# Patient Record
Sex: Female | Born: 1980 | Race: White | Hispanic: No | Smoking: Never smoker
Health system: Southern US, Community
[De-identification: ages and names within clinical notes are randomized; demographics above are authoritative.]

## PROBLEM LIST (undated history)

## (undated) DIAGNOSIS — B009 Herpesviral infection, unspecified: Secondary | ICD-10-CM

## (undated) DIAGNOSIS — J342 Deviated nasal septum: Secondary | ICD-10-CM

## (undated) DIAGNOSIS — J302 Other seasonal allergic rhinitis: Secondary | ICD-10-CM

## (undated) HISTORY — PX: LAPAROSCOPY: SHX197

## (undated) HISTORY — DX: Herpesviral infection, unspecified: B00.9

## (undated) HISTORY — PX: LIPOSUCTION: SHX10

## (undated) HISTORY — DX: Deviated nasal septum: J34.2

---

## 2002-03-21 DIAGNOSIS — Z9151 Personal history of suicidal behavior: Secondary | ICD-10-CM | POA: Insufficient documentation

## 2003-11-17 ENCOUNTER — Encounter: Admission: RE | Admit: 2003-11-17 | Discharge: 2003-11-17 | Payer: Self-pay | Admitting: Family Medicine

## 2004-05-28 ENCOUNTER — Other Ambulatory Visit: Admission: RE | Admit: 2004-05-28 | Discharge: 2004-05-28 | Payer: Self-pay | Admitting: Obstetrics and Gynecology

## 2004-06-11 ENCOUNTER — Other Ambulatory Visit: Admission: RE | Admit: 2004-06-11 | Discharge: 2004-06-11 | Payer: Self-pay | Admitting: Obstetrics and Gynecology

## 2004-10-08 ENCOUNTER — Other Ambulatory Visit: Admission: RE | Admit: 2004-10-08 | Discharge: 2004-10-08 | Payer: Self-pay | Admitting: Obstetrics and Gynecology

## 2005-02-16 ENCOUNTER — Ambulatory Visit: Payer: Self-pay | Admitting: Internal Medicine

## 2005-11-04 ENCOUNTER — Inpatient Hospital Stay (HOSPITAL_COMMUNITY): Admission: AD | Admit: 2005-11-04 | Discharge: 2005-11-07 | Payer: Self-pay | Admitting: *Deleted

## 2005-12-19 ENCOUNTER — Other Ambulatory Visit: Admission: RE | Admit: 2005-12-19 | Discharge: 2005-12-19 | Payer: Self-pay | Admitting: Obstetrics and Gynecology

## 2005-12-19 DIAGNOSIS — R87619 Unspecified abnormal cytological findings in specimens from cervix uteri: Secondary | ICD-10-CM | POA: Insufficient documentation

## 2006-01-12 ENCOUNTER — Other Ambulatory Visit: Admission: RE | Admit: 2006-01-12 | Discharge: 2006-01-12 | Payer: Self-pay | Admitting: Obstetrics and Gynecology

## 2006-01-12 DIAGNOSIS — R87810 Cervical high risk human papillomavirus (HPV) DNA test positive: Secondary | ICD-10-CM | POA: Insufficient documentation

## 2006-01-12 DIAGNOSIS — B977 Papillomavirus as the cause of diseases classified elsewhere: Secondary | ICD-10-CM | POA: Insufficient documentation

## 2006-04-19 ENCOUNTER — Ambulatory Visit: Payer: Self-pay | Admitting: Internal Medicine

## 2007-01-28 ENCOUNTER — Inpatient Hospital Stay (HOSPITAL_COMMUNITY): Admission: AD | Admit: 2007-01-28 | Discharge: 2007-01-28 | Payer: Self-pay | Admitting: Obstetrics and Gynecology

## 2007-02-12 ENCOUNTER — Inpatient Hospital Stay (HOSPITAL_COMMUNITY): Admission: AD | Admit: 2007-02-12 | Discharge: 2007-02-12 | Payer: Self-pay | Admitting: Obstetrics and Gynecology

## 2007-02-18 ENCOUNTER — Inpatient Hospital Stay (HOSPITAL_COMMUNITY): Admission: AD | Admit: 2007-02-18 | Discharge: 2007-02-18 | Payer: Self-pay | Admitting: Obstetrics and Gynecology

## 2007-03-02 ENCOUNTER — Inpatient Hospital Stay (HOSPITAL_COMMUNITY): Admission: AD | Admit: 2007-03-02 | Discharge: 2007-03-04 | Payer: Self-pay | Admitting: Obstetrics and Gynecology

## 2009-04-06 DIAGNOSIS — D271 Benign neoplasm of left ovary: Secondary | ICD-10-CM | POA: Insufficient documentation

## 2009-12-11 ENCOUNTER — Ambulatory Visit (HOSPITAL_COMMUNITY): Admission: RE | Admit: 2009-12-11 | Discharge: 2009-12-11 | Payer: Self-pay | Admitting: Obstetrics and Gynecology

## 2009-12-11 DIAGNOSIS — N83209 Unspecified ovarian cyst, unspecified side: Secondary | ICD-10-CM | POA: Insufficient documentation

## 2010-06-03 LAB — CBC
HCT: 39.5 % (ref 36.0–46.0)
MCH: 31 pg (ref 26.0–34.0)
RBC: 4.31 MIL/uL (ref 3.87–5.11)

## 2010-06-03 LAB — SURGICAL PCR SCREEN: MRSA, PCR: NEGATIVE
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
## 2010-08-06 NOTE — H&P (Signed)
NAMESEVIN, Autumn Villegas              ACCOUNT NO.:  [id number]   MEDICAL RECORD NO.:  [id number]          PATIENT TYPE:  MAT   LOCATION:  MATC                          [facility]ACILITY:  [facility]   PHYSICIAN:  Naima A. Dillard, M.D. DATE OF BIRTH:  [birth year]   DATE OF ADMISSION:  11/04/2005  DATE OF DISCHARGE:                                HISTORY & PHYSICAL   HISTORY OF THE PRESENT ILLNESS:  This is a 30 year old gravida 3, para 0, 0,  2, 0 at 39-2/7ths weeks who presents with leaking fluid since 9 P.M. with  the onset of mild contractions recently.  She reports positive fetal  movement.  This pregnancy has been followed by the Nurse Midwife Service and  has been remarkable for:  1. First trimester bleeding.  2. History of abnormal Pap.  3. Elective abortions times two.  4. History of depression and suicide attempt.  5. Group B Strep negative.   ALLERGIES:  None.   PAST OBSTETRICAL HISTORY:  Ob history is remarkable for elective abortions  in 2001 twice; once in January and once in May.   PAST MEDICAL HISTORY:  The medical history is remarkable for abnormal Pap in  2005 with colposcopy and biopsy, but no treatment.  She has occasional  __________ .  She had childhood varicella.  She has a history of depression  with suicide attempt in 2004.  History of cigarette use.   FAMILY HISTORY:  The family history is remarkable for a grandfather with a  heart attack.  Mother and sisters with thyroid disorders.  A sister with  migraines.  An unknown family member with breast cancer.  A sister with  depression.  A sister with drug use.   PAST SURGICAL HISTORY:  The surgical history is negative.   GENETIC HISTORY:  The genetic history is negative.   SOCIAL HISTORY:  The patient is single.  The father of the baby,  Autumn Villegas, is involved and supportive.  She does not report a religious  affiliation.  She denies any current alcohol, tobacco or drug use.   PRENATAL LABORATORY DATA:  Hemoglobin 12,  platelets 207,000.  Blood type B  positive, antibody screen negative.  RPR nonreactive.  Rubella immune.  Hepatitis B negative.  HIV negative.  Pap test normal.  Gonorrhea negative.  Chlamydia negative.  Cystic fibrosis negative.   HISTORY OF THE CURRENT PREGNANCY:  The patient entered care at seven weeks  gestation with first trimester spotting, which resolved later.  She had a  quad screen done, which was normal and an ultrasound at 18 weeks, which was  normal.  She had a Glucola at 26 weeks, which was normal.  She had a fall at  27 weeks with no complications.  She was group B Strep negative at term and  also GC and Chlamydia cultures were also negative.  She presents today in  labor.   OBJECTIVE:  VITAL SIGNS:  The vital signs are stable and she is afebrile.  HEENT:  The head, eyes, ears, nose and throat are within normal limits.  NECK:  Thyroid is normal and not  enlarged.  CHEST:  The chest is clear to auscultation.  HEART:  The heart has a regular rate and rhythm.  ABDOMEN:  The abdomen is gravid at 38 cm, vertex to Leopold's.  External  fetal monitor shows reactive fetal heart rate with contractions irregular at  every four to eight minutes.  VAGINAL EXAMINATION:  The cervix is 2 cm, 90% effaced, -2 station with a  vertex presentation.  There is clear fluid leaking from the vagina,  Nitrazine positive.  EXTREMITIES:  The extremities are within normal limits.   ASSESSMENT:  1. Intrauterine pregnancy at 39-2/7ths weeks.  2. Premature rupture of membranes at term.  3. Latent phase of labor.   PLAN:  1. Admit to birthing suite and Dr. Normand Sloop notified.  2. Routine C.N.M. orders.  3. Expectant management.  4. Epidural p.r.n. per patient's request.      [name], C.N.M.      Naima A. Normand Sloop, M.D.  Electronically Signed    MLW/MEDQ  D:  11/05/2005  T:  11/05/2005  Job:  [id number]

## 2010-12-27 LAB — CBC
HCT: 27.6 — ABNORMAL LOW
MCHC: 33.8
MCHC: 33.9
MCV: 81.4
MCV: 81.7
Platelets: 184
RDW: 14
RDW: 14.1
WBC: 9.3

## 2011-04-16 ENCOUNTER — Encounter (HOSPITAL_COMMUNITY): Payer: Self-pay | Admitting: *Deleted

## 2011-04-16 ENCOUNTER — Emergency Department (HOSPITAL_COMMUNITY)
Admission: EM | Admit: 2011-04-16 | Discharge: 2011-04-16 | Disposition: A | Discharge status: Home / Self Care | Payer: Managed Care, Other (non HMO) | Source: Home / Self Care | Attending: Family Medicine | Admitting: Family Medicine

## 2011-04-16 DIAGNOSIS — J310 Chronic rhinitis: Secondary | ICD-10-CM

## 2011-04-16 DIAGNOSIS — H6690 Otitis media, unspecified, unspecified ear: Secondary | ICD-10-CM

## 2011-04-16 HISTORY — DX: Other seasonal allergic rhinitis: J30.2

## 2011-04-16 MED ORDER — FLUTICASONE PROPIONATE 50 MCG/ACT NA SUSP: 2.0000 | Freq: Every day | NASAL | Status: DC

## 2011-04-16 MED ORDER — AMOXICILLIN-POT CLAVULANATE 875-125 MG PO TABS: 1.0000 | ORAL_TABLET | Freq: Two times a day (BID) | ORAL | Status: AC

## 2011-04-16 NOTE — ED Provider Notes (Signed)
History     CSN: [id number]  Arrival date & time 04/16/11  0915   First MD Initiated Contact with Patient 04/16/11 [phone]      Chief Complaint  Patient presents with  . Otalgia  . Nasal Congestion    (Consider location/radiation/quality/duration/timing/severity/associated sxs/prior treatment) HPI Comments: Autumn Villegas presents for evaluation of pain in her LEFT ear pain since yesterday. She reports hx of seasonal allergies and takes Zyrtec. She denies any fever or cough. She also now reports discomfort in her RIGHT ear.  Patient is a 31 y.o. female presenting with ear pain. The history is provided by the patient.  Otalgia This is a new problem. There is pain in the left ear. The problem occurs constantly. The problem has not changed since onset.There has been no fever. The pain is moderate. Associated symptoms include rhinorrhea. Pertinent negatives include no ear discharge, no headaches and no sore throat. Her past medical history does not include chronic ear infection.    Past Medical History  Diagnosis Date  . Seasonal allergies     Past Surgical History  Procedure Date  . Laparoscopy     History reviewed. No pertinent family history.  History  Substance Use Topics  . Smoking status: Never Smoker   . Smokeless tobacco: Not on file  . Alcohol Use: Yes    OB History    Grav Para Term Preterm Abortions TAB SAB Ect Mult Living                  Review of Systems  Constitutional: Negative.   HENT: Positive for ear pain and rhinorrhea. Negative for sore throat and ear discharge.   Eyes: Negative.   Respiratory: Negative.   Cardiovascular: Negative.   Gastrointestinal: Negative.   Genitourinary: Negative.   Musculoskeletal: Negative.   Skin: Negative.   Neurological: Negative.  Negative for headaches.    Allergies  Review of patient's allergies indicates no known allergies.  Home Medications   Current Outpatient Rx  Name Route Sig Dispense Refill  . TRINESSA  (28) PO Oral Take by mouth.    . AMOXICILLIN-POT CLAVULANATE 875-125 MG PO TABS Oral Take 1 tablet by mouth 2 (two) times daily. 20 tablet 0  . CETIRIZINE HCL 10 MG PO TABS Oral Take 10 mg by mouth daily.    Marland Kitchen FLUTICASONE PROPIONATE 50 MCG/ACT NA SUSP Nasal Place 2 sprays into the nose daily. 16 g 2    BP 100/67  Pulse 81  Temp(Src) 98.6 F (37 C) (Oral)  Resp 14  SpO2 99%  LMP 03/26/2011  Physical Exam  Nursing note and vitals reviewed. Constitutional: She is oriented to person, place, and time. She appears well-developed and well-nourished.  HENT:  Head: Normocephalic and atraumatic.  Right Ear: Tympanic membrane is retracted.  Left Ear: Tympanic membrane is erythematous and bulging.  Mouth/Throat: Uvula is midline, oropharynx is clear and moist and mucous membranes are normal.  Eyes: EOM are normal.  Neck: Normal range of motion.  Pulmonary/Chest: Effort normal and breath sounds normal. She has no wheezes. She has no rhonchi.  Musculoskeletal: Normal range of motion.  Neurological: She is alert and oriented to person, place, and time.  Skin: Skin is warm and dry.  Psychiatric: Her behavior is normal.    ED Course  Procedures (including critical care time)  Labs Reviewed - No data to display No results found.   1. Otitis media   2. Rhinitis       MDM  rx given  for Augmentin, fluticasone; supportive care        Richardo Priest, MD 04/16/11 [phone]

## 2011-04-16 NOTE — ED Notes (Signed)
Pt with onset of sinus congestion and ear pain yesterday

## 2011-04-26 DIAGNOSIS — Z9882 Breast implant status: Secondary | ICD-10-CM | POA: Insufficient documentation

## 2011-06-21 HISTORY — PX: OTHER SURGICAL HISTORY: SHX169

## 2011-10-08 ENCOUNTER — Other Ambulatory Visit: Payer: Self-pay | Admitting: Obstetrics and Gynecology

## 2011-10-10 ENCOUNTER — Telehealth: Payer: Self-pay | Admitting: Obstetrics and Gynecology

## 2011-10-10 NOTE — Telephone Encounter (Signed)
Jackie/AR pt °

## 2011-10-10 NOTE — Telephone Encounter (Signed)
Ar pt 

## 2011-10-10 NOTE — Telephone Encounter (Signed)
Spoke to pt to let her know I called in RF on Valtrex 500 mg 1 po bid x 3 days # 6 0 RF's to [employer]. Pt needs to sched AEX, as she is overdue . Pt will sched. At her convenience. JO, CMA.

## 2011-10-10 NOTE — Telephone Encounter (Signed)
Jackie/epic °

## 2011-12-12 ENCOUNTER — Encounter: Payer: Self-pay | Admitting: Obstetrics and Gynecology

## 2011-12-12 ENCOUNTER — Ambulatory Visit (INDEPENDENT_AMBULATORY_CARE_PROVIDER_SITE_OTHER): Payer: Managed Care, Other (non HMO) | Admitting: Obstetrics and Gynecology

## 2011-12-12 VITALS — BP 110/70 | HR 70 | Resp 62 | Ht 63.0 in | Wt 150.0 lb

## 2011-12-12 DIAGNOSIS — Z124 Encounter for screening for malignant neoplasm of cervix: Secondary | ICD-10-CM

## 2011-12-12 DIAGNOSIS — Z139 Encounter for screening, unspecified: Secondary | ICD-10-CM

## 2011-12-12 MED ORDER — NORGESTIM-ETH ESTRAD TRIPHASIC 0.18/0.215/0.25 MG-35 MCG PO TABS: 1.0000 | ORAL_TABLET | Freq: Every day | ORAL | Status: DC

## 2011-12-12 MED ORDER — VALACYCLOVIR HCL 500 MG PO TABS: 500.0000 mg | ORAL_TABLET | Freq: Two times a day (BID) | ORAL | Status: AC | PRN
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
## 2011-12-14 LAB — PAP IG W/ RFLX HPV ASCU

## 2011-12-15 LAB — HUMAN PAPILLOMAVIRUS, HIGH RISK: HPV DNA High Risk: DETECTED — AB

## 2011-12-19 ENCOUNTER — Telehealth: Payer: Self-pay

## 2011-12-19 NOTE — Telephone Encounter (Signed)
Left message for pt to return call. Pt needs colpo. Autumn Villegas

## 2011-12-19 NOTE — Telephone Encounter (Signed)
Returned pt's call regarding scheduling Colposcopy. Pt is scheduled for 10/14 2013 @ 3:00 pm.  Pt was given all instructions. Autumn Villegas

## 2012-01-02 ENCOUNTER — Ambulatory Visit (INDEPENDENT_AMBULATORY_CARE_PROVIDER_SITE_OTHER): Payer: Managed Care, Other (non HMO) | Admitting: Obstetrics and Gynecology

## 2012-01-02 ENCOUNTER — Encounter: Payer: Self-pay | Admitting: Obstetrics and Gynecology

## 2012-01-02 VITALS — BP 98/66 | Resp 16 | Ht 63.0 in | Wt 150.0 lb

## 2012-01-02 DIAGNOSIS — IMO0002 Reserved for concepts with insufficient information to code with codable children: Secondary | ICD-10-CM

## 2012-01-02 DIAGNOSIS — R87612 Low grade squamous intraepithelial lesion on cytologic smear of cervix (LGSIL): Secondary | ICD-10-CM

## 2012-01-02 DIAGNOSIS — R87811 Vaginal high risk human papillomavirus (HPV) DNA test positive: Secondary | ICD-10-CM

## 2012-01-02 NOTE — Progress Notes (Signed)
Here for colpo secondary to ASCUS and +HRHPV  Filed Vitals:   01/02/12 1530  BP: 98/66  Resp: 16   ROS: noncontributory  Pelvic exam:  VULVA: normal appearing vulva with no masses, tenderness or lesions,  VAGINA: normal appearing vagina with normal color and discharge, no lesions, CERVIX: normal appearing cervix without discharge or lesions,   Colpo performed per protocol AW around entire TZ  A/P Bxs at 2 and 7 O'clock and ECC RTO in 1-2 wks for f/u

## 2012-01-04 LAB — PATHOLOGY

## 2012-01-19 ENCOUNTER — Encounter: Payer: Self-pay | Admitting: Obstetrics and Gynecology

## 2012-01-19 ENCOUNTER — Ambulatory Visit (INDEPENDENT_AMBULATORY_CARE_PROVIDER_SITE_OTHER): Payer: Managed Care, Other (non HMO) | Admitting: Obstetrics and Gynecology

## 2012-01-19 VITALS — BP 100/64 | Temp 99.0°F | Ht 63.0 in | Wt 151.0 lb

## 2012-01-19 DIAGNOSIS — N870 Mild cervical dysplasia: Secondary | ICD-10-CM

## 2012-01-19 NOTE — Progress Notes (Signed)
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

## 2012-01-21 ENCOUNTER — Emergency Department (HOSPITAL_COMMUNITY)
Admission: EM | Admit: 2012-01-21 | Discharge: 2012-01-21 | Disposition: A | Discharge status: Home / Self Care | Payer: Managed Care, Other (non HMO) | Source: Home / Self Care | Attending: Family Medicine | Admitting: Family Medicine

## 2012-01-21 ENCOUNTER — Encounter (HOSPITAL_COMMUNITY): Payer: Self-pay | Admitting: *Deleted

## 2012-01-21 DIAGNOSIS — K2960 Other gastritis without bleeding: Secondary | ICD-10-CM

## 2012-01-21 DIAGNOSIS — R112 Nausea with vomiting, unspecified: Secondary | ICD-10-CM

## 2012-01-21 MED ORDER — OMEPRAZOLE 40 MG PO CPDR: 40.0000 mg | DELAYED_RELEASE_CAPSULE | Freq: Every day | ORAL | Status: DC

## 2012-01-21 MED ORDER — ONDANSETRON HCL 4 MG/2ML IJ SOLN
INTRAMUSCULAR | Status: AC
  Filled 2012-01-21: qty 2

## 2012-01-21 MED ORDER — ONDANSETRON 4 MG PO TBDP
8.0000 mg | ORAL_TABLET | Freq: Once | ORAL | Status: AC
  Administered 2012-01-21: 8 mg via ORAL

## 2012-01-21 MED ORDER — ONDANSETRON HCL 4 MG PO TABS: 4.0000 mg | ORAL_TABLET | Freq: Four times a day (QID) | ORAL | Status: DC

## 2012-01-21 MED ORDER — GI COCKTAIL ~~LOC~~
ORAL | Status: AC
  Filled 2012-01-21: qty 30

## 2012-01-21 MED ORDER — ONDANSETRON 4 MG PO TBDP
ORAL_TABLET | ORAL | Status: AC
  Filled 2012-01-21: qty 2

## 2012-01-21 MED ORDER — ONDANSETRON HCL 4 MG/2ML IJ SOLN
4.0000 mg | Freq: Once | INTRAMUSCULAR | Status: AC
  Administered 2012-01-21: 4 mg via INTRAMUSCULAR

## 2012-01-21 MED ORDER — GI COCKTAIL ~~LOC~~
30.0000 mL | Freq: Once | ORAL | Status: AC
  Administered 2012-01-21: 30 mL via ORAL

## 2012-01-21 NOTE — ED Notes (Signed)
Pt  Reports  Symptoms    Of       Nausea   /  Vomiting                Since  12  Am          No  Diarrhea    Pt  Has  Reported  Symptoms    Of  Congested  And  Uri  Symptoms  X  3  Days

## 2012-01-21 NOTE — ED Provider Notes (Signed)
History     CSN: [id number]  Arrival date & time 01/21/12  1429   None     Chief Complaint  Patient presents with  . Nausea    (Consider location/radiation/quality/duration/timing/severity/associated sxs/prior treatment) The history is provided by the patient.  patient reports uri symptoms over the past week.  States last night she ate pizza for dinner.  This morning nausea with reflux symptoms since awakening.  States emesis clear and yellow in color.  Unable to eat or keep water/ginger ale down, reports vomiting immediately after.  Known history of gastric reflux, not currently taking medications.   States she has had similar symptoms in the past.  Denies fever, blood in emesis or abdominal pain.     Past Medical History  Diagnosis Date  . Seasonal allergies   . Western blot positive HSV2     Past Surgical History  Procedure Date  . Laparoscopy   . Bilateral breast implaints 06/21/2011    Family History  Problem Relation Age of Onset  . Arthritis Mother   . Asthma Mother   . Thyroid disease Mother     History  Substance Use Topics  . Smoking status: Never Smoker   . Smokeless tobacco: Not on file  . Alcohol Use: Yes    OB History    Grav Para Term Preterm Abortions TAB SAB Ect Mult Living                  Review of Systems  Constitutional: Negative.   Respiratory: Negative.   Cardiovascular: Negative.   Gastrointestinal: Positive for nausea and vomiting. Negative for abdominal pain, diarrhea and constipation.  Genitourinary: Negative.     Allergies  Review of patient's allergies indicates no known allergies.  Home Medications   Current Outpatient Rx  Name Route Sig Dispense Refill  . CETIRIZINE HCL 10 MG PO TABS Oral Take 10 mg by mouth daily.    Marland Kitchen FLUTICASONE PROPIONATE 50 MCG/ACT NA SUSP Nasal Place 2 sprays into the nose daily. 16 g 2  . TRINESSA (28) PO Oral Take by mouth.    Darlis Loan ESTRAD TRIPHASIC 0.18/0.215/0.25 MG-35 MCG PO TABS  Oral Take 1 tablet by mouth daily. 3 Package 3  . OMEPRAZOLE 40 MG PO CPDR Oral Take 1 capsule (40 mg total) by mouth daily. 30 capsule 1  . ONDANSETRON HCL 4 MG PO TABS Oral Take 1 tablet (4 mg total) by mouth every 6 (six) hours. 12 tablet 0  . VALACYCLOVIR HCL 500 MG PO TABS Oral Take 1 tablet (500 mg total) by mouth 2 (two) times daily as needed. 6 tablet 5    BP 122/84  Pulse 72  Temp 98.6 F (37 C) (Oral)  Resp 18  SpO2 100%  LMP 01/09/2012  Physical Exam  Nursing note and vitals reviewed. Constitutional: She is oriented to person, place, and time. Vital signs are normal. She appears well-developed and well-nourished. She is active and cooperative.  HENT:  Head: Normocephalic.  Mouth/Throat: No oropharyngeal exudate.  Eyes: Conjunctivae normal are normal. Pupils are equal, round, and reactive to light. No scleral icterus.  Neck: Trachea normal and normal range of motion. Neck supple.  Cardiovascular: Normal rate, regular rhythm, normal heart sounds and intact distal pulses.   Pulmonary/Chest: Effort normal and breath sounds normal.  Abdominal: Soft. Bowel sounds are normal. There is no tenderness. There is no rebound and no guarding.  Musculoskeletal: Normal range of motion.  Lymphadenopathy:    She has no cervical  adenopathy.  Neurological: She is alert and oriented to person, place, and time. No cranial nerve deficit or sensory deficit.  Skin: Skin is warm and dry.  Psychiatric: She has a normal mood and affect. Her speech is normal and behavior is normal. Judgment and thought content normal. Cognition and memory are normal.    ED Course  Procedures (including critical care time)  Labs Reviewed - No data to display No results found.   1. Reflux gastritis   2. Nausea & vomiting       MDM  zofran odt administered-unable to tolerate. zofran 4mg  IM administered.  1738-able to tolerate po fluids Take medication as prescribed, start with clear liquid progress to  regular diet as tolerated.  Follow up with primary care provider or gi specialist if symptoms are not improved.         Johnsie Kindred, NP 01/21/12 1742

## 2012-01-21 NOTE — ED Notes (Signed)
Pt  Vomited  The  Cocktail  And  Po  zofran

## 2012-01-22 NOTE — ED Provider Notes (Signed)
Medical screening examination/treatment/procedure(s) were performed by non-physician practitioner and as supervising physician I was immediately available for consultation/collaboration.   [facility]; MD   Sharin Grave, MD 01/22/12 1409

## 2013-02-06 ENCOUNTER — Other Ambulatory Visit: Payer: Self-pay | Admitting: Obstetrics and Gynecology

## 2014-01-30 ENCOUNTER — Other Ambulatory Visit: Payer: Self-pay | Admitting: Obstetrics and Gynecology

## 2015-07-30 ENCOUNTER — Encounter: Payer: Self-pay | Admitting: Emergency Medicine

## 2015-07-30 ENCOUNTER — Emergency Department
Admission: EM | Admit: 2015-07-30 | Discharge: 2015-07-30 | Disposition: A | Discharge status: Home / Self Care | Payer: BLUE CROSS/BLUE SHIELD | Attending: Emergency Medicine | Admitting: Emergency Medicine

## 2015-07-30 DIAGNOSIS — Z79899 Other long term (current) drug therapy: Secondary | ICD-10-CM | POA: Insufficient documentation

## 2015-07-30 DIAGNOSIS — R112 Nausea with vomiting, unspecified: Secondary | ICD-10-CM

## 2015-07-30 LAB — COMPREHENSIVE METABOLIC PANEL
ALBUMIN: 4.1 g/dL (ref 3.5–5.0)
ALK PHOS: 78 U/L (ref 38–126)
ALT: 16 U/L (ref 14–54)
ANION GAP: 10 (ref 5–15)
AST: 22 U/L (ref 15–41)
BILIRUBIN TOTAL: 0.7 mg/dL (ref 0.3–1.2)
BUN: 12 mg/dL (ref 6–20)
CALCIUM: 8.8 mg/dL — AB (ref 8.9–10.3)
CO2: 25 mmol/L (ref 22–32)
CREATININE: 0.66 mg/dL (ref 0.44–1.00)
Chloride: 103 mmol/L (ref 101–111)
GFR calc Af Amer: >60 mL/min (ref >60)
GFR calc non Af Amer: >60 mL/min (ref >60)
GLUCOSE: 94 mg/dL (ref 65–99)
POTASSIUM: 4 mmol/L (ref 3.5–5.1)
SODIUM: 138 mmol/L (ref 135–145)
TOTAL PROTEIN: 7.6 g/dL (ref 6.5–8.1)

## 2015-07-30 LAB — CBC WITH DIFFERENTIAL/PLATELET
BASOS ABS: 0.1 10*3/uL (ref 0–0.1)
Basophils Relative: 1 %
Eosinophils Absolute: 0.1 10*3/uL (ref 0–0.7)
HCT: 41.2 % (ref 35.0–47.0)
Hemoglobin: 14 g/dL (ref 12.0–16.0)
LYMPHS ABS: 1 10*3/uL (ref 1.0–3.6)
MCH: 30.2 pg (ref 26.0–34.0)
MCHC: 34 g/dL (ref 32.0–36.0)
MCV: 88.8 fL (ref 80.0–100.0)
MONO ABS: 0.6 10*3/uL (ref 0.2–0.9)
Monocytes Relative: 6 %
Neutro Abs: 8.2 10*3/uL — ABNORMAL HIGH (ref 1.4–6.5)
Neutrophils Relative %: 82 %
PLATELETS: 202 10*3/uL (ref 150–440)
RBC: 4.64 MIL/uL (ref 3.80–5.20)
RDW: 13.8 % (ref 11.5–14.5)
WBC: 9.9 10*3/uL (ref 3.6–11.0)

## 2015-07-30 LAB — URINALYSIS COMPLETE WITH MICROSCOPIC (ARMC ONLY)
BILIRUBIN URINE: NEGATIVE
Bacteria, UA: NONE SEEN
GLUCOSE, UA: NEGATIVE mg/dL
Hgb urine dipstick: NEGATIVE
Ketones, ur: NEGATIVE mg/dL
Nitrite: NEGATIVE
Protein, ur: 30 mg/dL — AB
Specific Gravity, Urine: 1.031 — ABNORMAL HIGH (ref 1.005–1.030)
pH: 5 (ref 5.0–8.0)

## 2015-07-30 LAB — POCT PREGNANCY, URINE: PREG TEST UR: NEGATIVE

## 2015-07-30 LAB — LIPASE, BLOOD: Lipase: 17 U/L (ref 11–51)

## 2015-07-30 MED ORDER — PROCHLORPERAZINE EDISYLATE 5 MG/ML IJ SOLN
INTRAMUSCULAR | Status: AC
  Administered 2015-07-30: 10 mg via INTRAVENOUS
  Filled 2015-07-30: qty 2

## 2015-07-30 MED ORDER — SODIUM CHLORIDE 0.9 % IV BOLUS (SEPSIS)
1000.0000 mL | Freq: Once | INTRAVENOUS | Status: AC
  Administered 2015-07-30: 1000 mL via INTRAVENOUS

## 2015-07-30 MED ORDER — PROCHLORPERAZINE EDISYLATE 5 MG/ML IJ SOLN
10.0000 mg | Freq: Once | INTRAMUSCULAR | Status: AC
  Administered 2015-07-30: 10 mg via INTRAVENOUS

## 2015-07-30 NOTE — ED Notes (Signed)
Pt presents to ED with vomiting and RLQ tenderness since approximately three this morning. Pt reports went to acute care this morning and was sent for evaluation. Pt stated she was not having abdominal pain until the doctor in acute care pressed on her abdomen. Pt reports low grade fever. Pt denies diarrhea.

## 2015-07-30 NOTE — ED Provider Notes (Signed)
[facility] Emergency Department Provider Note   ____________________________________________  Time seen: ~1425  I have reviewed the triage vital signs and the nursing notes.   HISTORY  Chief Complaint Emesis and Abdominal Pain   History limited by: Not Limited   HPI Autumn AltoJessica Villegas is a 35 y.o. female who presents to the emergency department today because of concerns for nausea and vomiting and some tenderness on exam. Patient states that she went to urgent care today because she started having nausea and vomiting yesterday. She states that she continued to have these symptoms this morning. She was able to get some applesauce this morning. She states that when she went to urgent care with a starter pressing on her stomach she started having some pain in the right lower quadrant. She states that she had not noticed any pain prior to this exam. She does states she had an associated fever. She states that she has not noticed any change in her defecation or urination.    Past Medical History  Diagnosis Date  . Seasonal allergies   . Western blot positive HSV2     Patient Active Problem List   Diagnosis Date Noted  . Dysplasia of cervix, low grade (CIN 1) 01/19/2012    Past Surgical History  Procedure Laterality Date  . Laparoscopy    . Bilateral breast implaints  06/21/2011  . Liposuction      Current Outpatient Rx  Name  Route  Sig  Dispense  Refill  . cetirizine (ZYRTEC) 10 MG tablet   Oral   Take 10 mg by mouth daily.         Marland Kitchen. EXPIRED: fluticasone (FLONASE) 50 MCG/ACT nasal spray   Nasal   Place 2 sprays into the nose daily.   16 g   2   . Norgestim-Eth Estrad Triphasic (TRINESSA, 28, PO)   Oral   Take by mouth.         . Norgestimate-Ethinyl Estradiol Triphasic 0.18/0.215/0.25 MG-35 MCG tablet   Oral   Take 1 tablet by mouth daily.   3 Package   3   . omeprazole (PRILOSEC) 40 MG capsule   Oral   Take 1 capsule (40 mg total) by  mouth daily.   30 capsule   1   . ondansetron (ZOFRAN) 4 MG tablet   Oral   Take 1 tablet (4 mg total) by mouth every 6 (six) hours.   12 tablet   0   . valACYclovir (VALTREX) 500 MG tablet   Oral   Take 1 tablet (500 mg total) by mouth 2 (two) times daily as needed.   6 tablet   5     Allergies Review of patient's allergies indicates no known allergies.  Family History  Problem Relation Age of Onset  . Arthritis Mother   . Asthma Mother   . Thyroid disease Mother     Social History Social History  Substance Use Topics  . Smoking status: Never Smoker   . Smokeless tobacco: None  . Alcohol Use: Yes    Review of Systems  Constitutional: Negative for fever. Cardiovascular: Negative for chest pain. Respiratory: Negative for shortness of breath. Gastrointestinal: Negative for abdominal pain. Positive for nausea and vomiting. Neurological: Negative for headaches, focal weakness or numbness.  10-point ROS otherwise negative.  ____________________________________________   PHYSICAL EXAM:  VITAL SIGNS: ED Triage Vitals  Enc Vitals Group     BP 07/30/15 1243 112/81 mmHg     Pulse Rate 07/30/15  1243 106     Resp 07/30/15 1243 18     Temp 07/30/15 1243 99 F (37.2 C)     Temp Source 07/30/15 1243 Oral     SpO2 07/30/15 1243 100 %     Weight 07/30/15 1243 160 lb (72.576 kg)     Height 07/30/15 1243  (1.6 m)     Head Cir --      Peak Flow --      Pain Score 07/30/15 1244 9  } Constitutional: Alert and oriented. Well appearing and in no distress. Eyes: Conjunctivae are normal. PERRL. Normal extraocular movements. ENT   Head: Normocephalic and atraumatic.   Nose: No congestion/rhinnorhea.   Mouth/Throat: Mucous membranes are moist.   Neck: No stridor. Hematological/Lymphatic/Immunilogical: No cervical lymphadenopathy. Cardiovascular: Normal rate, regular rhythm.  No murmurs, rubs, or gallops. Respiratory: Normal respiratory effort without  tachypnea nor retractions. Breath sounds are clear and equal bilaterally. No wheezes/rales/rhonchi. Gastrointestinal: Soft and mildly tender to palpation in the right lower quadrant. No rebound. No guarding. No Rovsing's. Genitourinary: Deferred Musculoskeletal: Normal range of motion in all extremities. No joint effusions.  No lower extremity tenderness nor edema. Neurologic:  Normal speech and language. No gross focal neurologic deficits are appreciated.  Skin:  Skin is warm, dry and intact. No rash noted. Psychiatric: Mood and affect are normal. Speech and behavior are normal. Patient exhibits appropriate insight and judgment.  ____________________________________________    LABS (pertinent positives/negatives)  Labs Reviewed  COMPREHENSIVE METABOLIC PANEL - Abnormal; Notable for the following:    Calcium 8.8 (*)    All other components within normal limits  URINALYSIS COMPLETEWITH MICROSCOPIC ([facility] ONLY) - Abnormal; Notable for the following:    Color, Urine YELLOW (*)    APPearance HAZY (*)    Specific Gravity, Urine 1.031 (*)    Protein, ur 30 (*)    Leukocytes, UA TRACE (*)    Squamous Epithelial / LPF 6-30 (*)    All other components within normal limits  CBC WITH DIFFERENTIAL/PLATELET - Abnormal; Notable for the following:    Neutro Abs 8.2 (*)    All other components within normal limits  LIPASE, BLOOD  POC URINE PREG, ED  POCT PREGNANCY, URINE     ____________________________________________   EKG  None  ____________________________________________    RADIOLOGY  None  ____________________________________________   PROCEDURES  Procedure(s) performed: None  Critical Care performed: No  ____________________________________________   INITIAL IMPRESSION / ASSESSMENT AND PLAN / ED COURSE  Pertinent labs & imaging results that were available during my care of the patient were reviewed by me and considered in my medical decision making (see chart for  details).  Patient presented to the emergency department today because of concerns for nausea and vomiting and while at urgent care she did have some tenderness in the right lower quadrant. On my exam she has some mild tenderness in the right lower quadrant. No rebound or guarding. Blood work without any leukocytosis. She was minimally tachycardic.  ----------------------------------------- 3:30 PM on 07/30/2015 -----------------------------------------  Patient states that she feels better after medication. On repeat abdominal exam there is no longer any tenderness. Patient states that her abdomen feels better. No leukocytosis on blood work. At this point I did have discussed with the patient. I think it would be unlikely be appendicitis given no leukocytosis and no current abdominal pain or tenderness. Did discuss however that we could obtain a CT scan. Patient is comfortable with going home. I did discuss return  precautions with the patient particular the surrounding appendicitis.  ____________________________________________   FINAL CLINICAL IMPRESSION(S) / ED DIAGNOSES  Final diagnoses:  Nausea and vomiting, vomiting of unspecified type     Phineas Semen, MD 07/30/15 1531

## 2015-07-30 NOTE — ED Notes (Signed)
AAOx3.  Skin warm and dry. NAD.  Ambulates with easy and steady gait.   

## 2015-07-30 NOTE — Discharge Instructions (Signed)
Please seek medical attention for any high fevers, chest pain, shortness of breath, change in behavior, persistent vomiting, bloody stool or any other new or concerning symptoms. ° ° °Nausea and Vomiting °Nausea is a sick feeling that often comes before throwing up (vomiting). Vomiting is a reflex where stomach contents come out of your mouth. Vomiting can cause severe loss of body fluids (dehydration). Children and elderly adults can become dehydrated quickly, especially if they also have diarrhea. Nausea and vomiting are symptoms of a condition or disease. It is important to find the cause of your symptoms. °CAUSES  °· Direct irritation of the stomach lining. This irritation can result from increased acid production (gastroesophageal reflux disease), infection, food poisoning, taking certain medicines (such as nonsteroidal anti-inflammatory drugs), alcohol use, or tobacco use. °· Signals from the brain. These signals could be caused by a headache, heat exposure, an inner ear disturbance, increased pressure in the brain from injury, infection, a tumor, or a concussion, pain, emotional stimulus, or metabolic problems. °· An obstruction in the gastrointestinal tract (bowel obstruction). °· Illnesses such as diabetes, hepatitis, gallbladder problems, appendicitis, kidney problems, cancer, sepsis, atypical symptoms of a heart attack, or eating disorders. °· Medical treatments such as chemotherapy and radiation. °· Receiving medicine that makes you sleep (general anesthetic) during surgery. °DIAGNOSIS °Your caregiver may ask for tests to be done if the problems do not improve after a few days. Tests may also be done if symptoms are severe or if the reason for the nausea and vomiting is not clear. Tests may include: °· Urine tests. °· Blood tests. °· Stool tests. °· Cultures (to look for evidence of infection). °· X-rays or other imaging studies. °Test results can help your caregiver make decisions about treatment or the  need for additional tests. °TREATMENT °You need to stay well hydrated. Drink frequently but in small amounts. You may wish to drink water, sports drinks, clear broth, or eat frozen ice pops or gelatin dessert to help stay hydrated. When you eat, eating slowly may help prevent nausea. There are also some antinausea medicines that may help prevent nausea. °HOME CARE INSTRUCTIONS  °· Take all medicine as directed by your caregiver. °· If you do not have an appetite, do not force yourself to eat. However, you must continue to drink fluids. °· If you have an appetite, eat a normal diet unless your caregiver tells you differently. °¨ Eat a variety of complex carbohydrates (rice, wheat, potatoes, bread), lean meats, yogurt, fruits, and vegetables. °¨ Avoid high-fat foods because they are more difficult to digest. °· Drink enough water and fluids to keep your urine clear or pale yellow. °· If you are dehydrated, ask your caregiver for specific rehydration instructions. Signs of dehydration may include: °¨ Severe thirst. °¨ Dry lips and mouth. °¨ Dizziness. °¨ Dark urine. °¨ Decreasing urine frequency and amount. °¨ Confusion. °¨ Rapid breathing or pulse. °SEEK IMMEDIATE MEDICAL CARE IF:  °· You have blood or brown flecks (like coffee grounds) in your vomit. °· You have black or bloody stools. °· You have a severe headache or stiff neck. °· You are confused. °· You have severe abdominal pain. °· You have chest pain or trouble breathing. °· You do not urinate at least once every 8 hours. °· You develop cold or clammy skin. °· You continue to vomit for longer than 24 to 48 hours. °· You have a fever. °MAKE SURE YOU:  °· Understand these instructions. °· Will watch your condition. °· Will get help right away   if you are not doing well or get worse. °  °This information is not intended to replace advice given to you by your health care provider. Make sure you discuss any questions you have with your health care provider. °    °Document Released: 03/07/2005 Document Revised: 05/30/2011 Document Reviewed: 08/04/2010 °Elsevier Interactive Patient Education ©2016 [employer] ° °

## 2018-05-24 DIAGNOSIS — N8000 Endometriosis of the uterus, unspecified: Secondary | ICD-10-CM | POA: Insufficient documentation

## 2019-04-23 DIAGNOSIS — A6000 Herpesviral infection of urogenital system, unspecified: Secondary | ICD-10-CM | POA: Insufficient documentation

## 2019-04-23 DIAGNOSIS — K649 Unspecified hemorrhoids: Secondary | ICD-10-CM | POA: Insufficient documentation

## 2019-04-23 DIAGNOSIS — E559 Vitamin D deficiency, unspecified: Secondary | ICD-10-CM | POA: Insufficient documentation

## 2019-04-23 DIAGNOSIS — F32A Depression, unspecified: Secondary | ICD-10-CM | POA: Insufficient documentation

## 2019-10-08 ENCOUNTER — Other Ambulatory Visit: Payer: Self-pay | Admitting: Allergy

## 2019-10-08 ENCOUNTER — Ambulatory Visit
Admission: RE | Admit: 2019-10-08 | Discharge: 2019-10-08 | Disposition: A | Discharge status: Home / Self Care | Payer: 59 | Source: Ambulatory Visit | Attending: Allergy | Admitting: Allergy

## 2019-10-08 DIAGNOSIS — R059 Cough, unspecified: Secondary | ICD-10-CM

## 2020-07-13 ENCOUNTER — Encounter: Payer: Self-pay | Admitting: Allergy

## 2021-02-16 IMAGING — CR DG CHEST 2V
2 series · 2 of 2 positions shown · non-contrast
Comparison: None.

CLINICAL DATA: Productive cough 5 weeks.  Smoker

EXAM:
CHEST - 2 VIEW

[w chest pa]
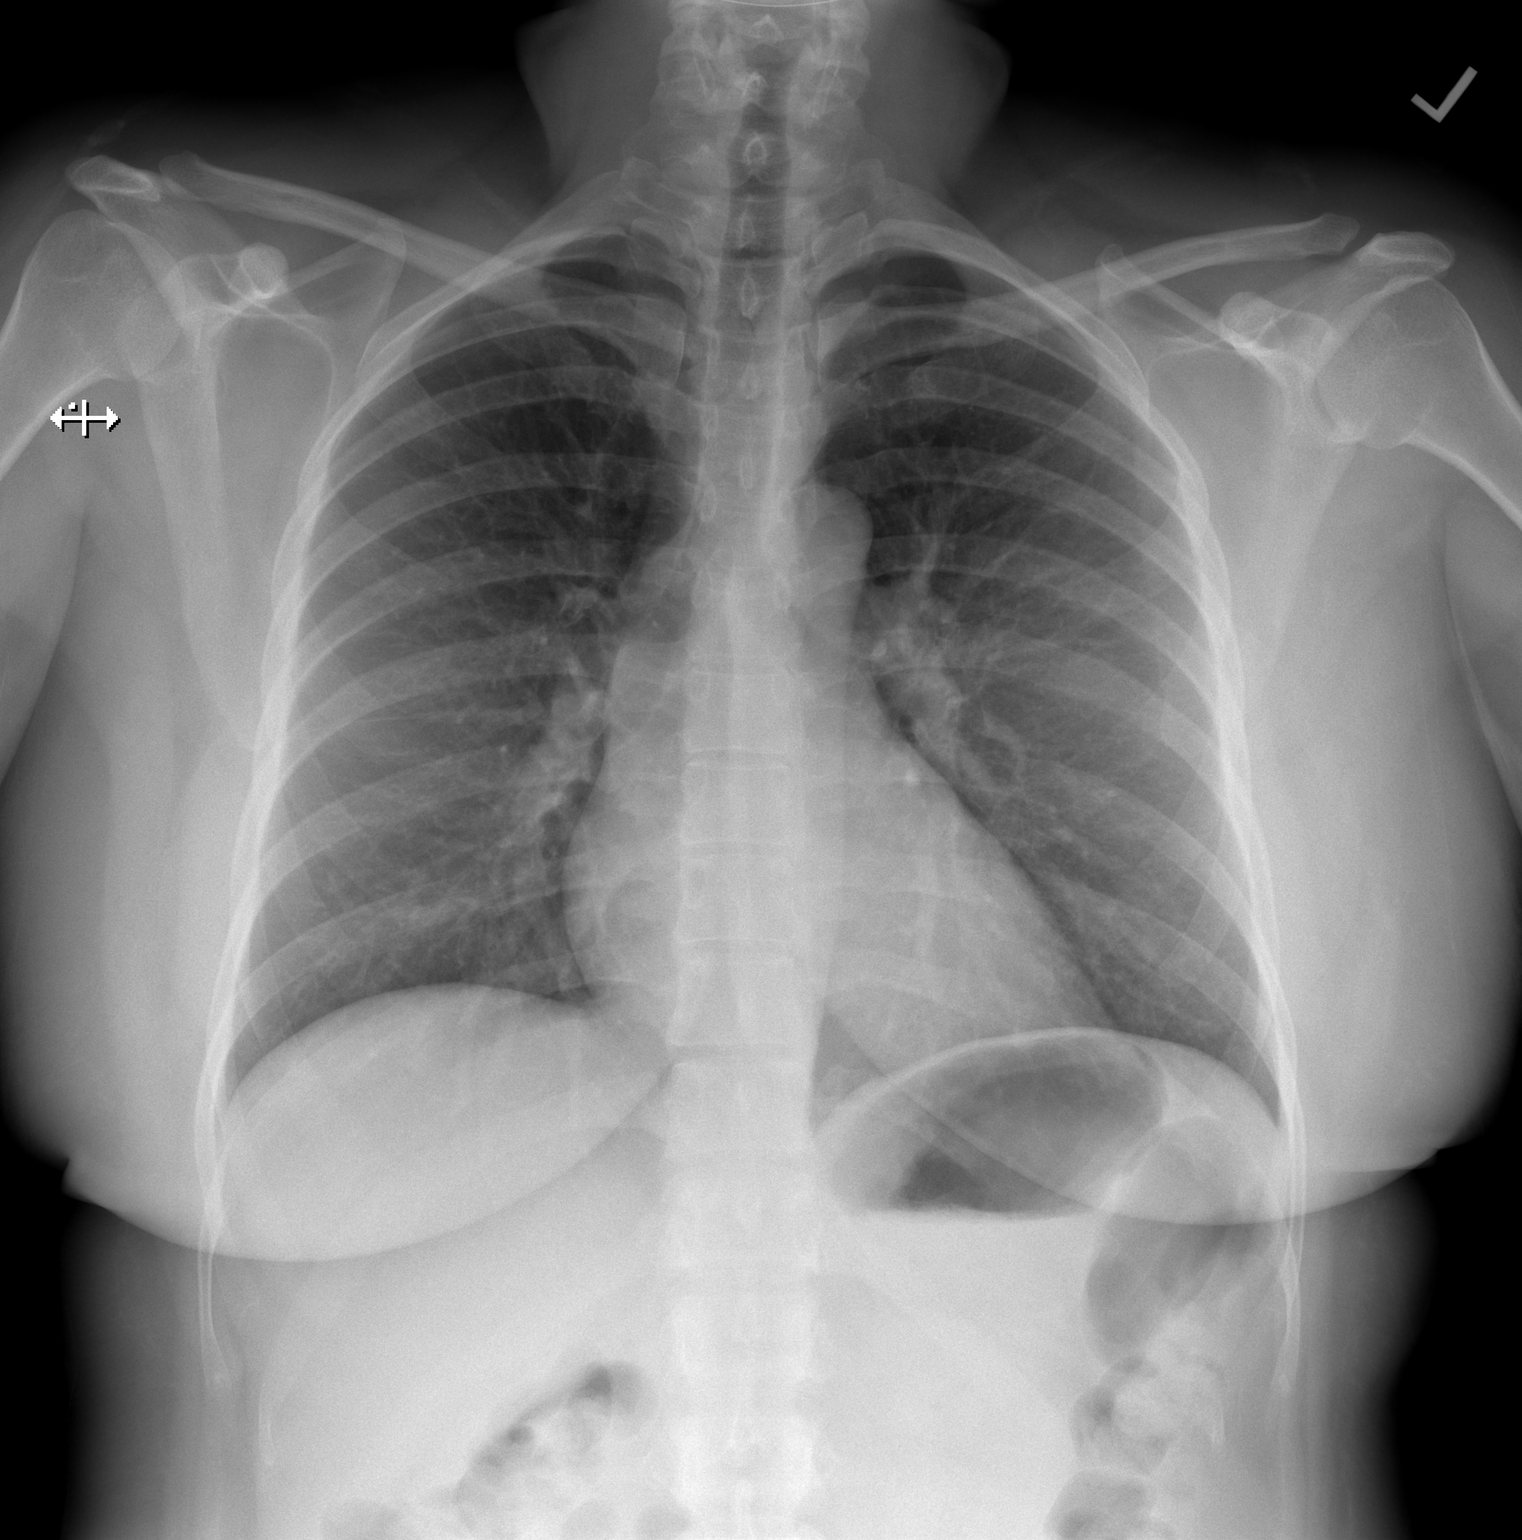

[w chest lat]
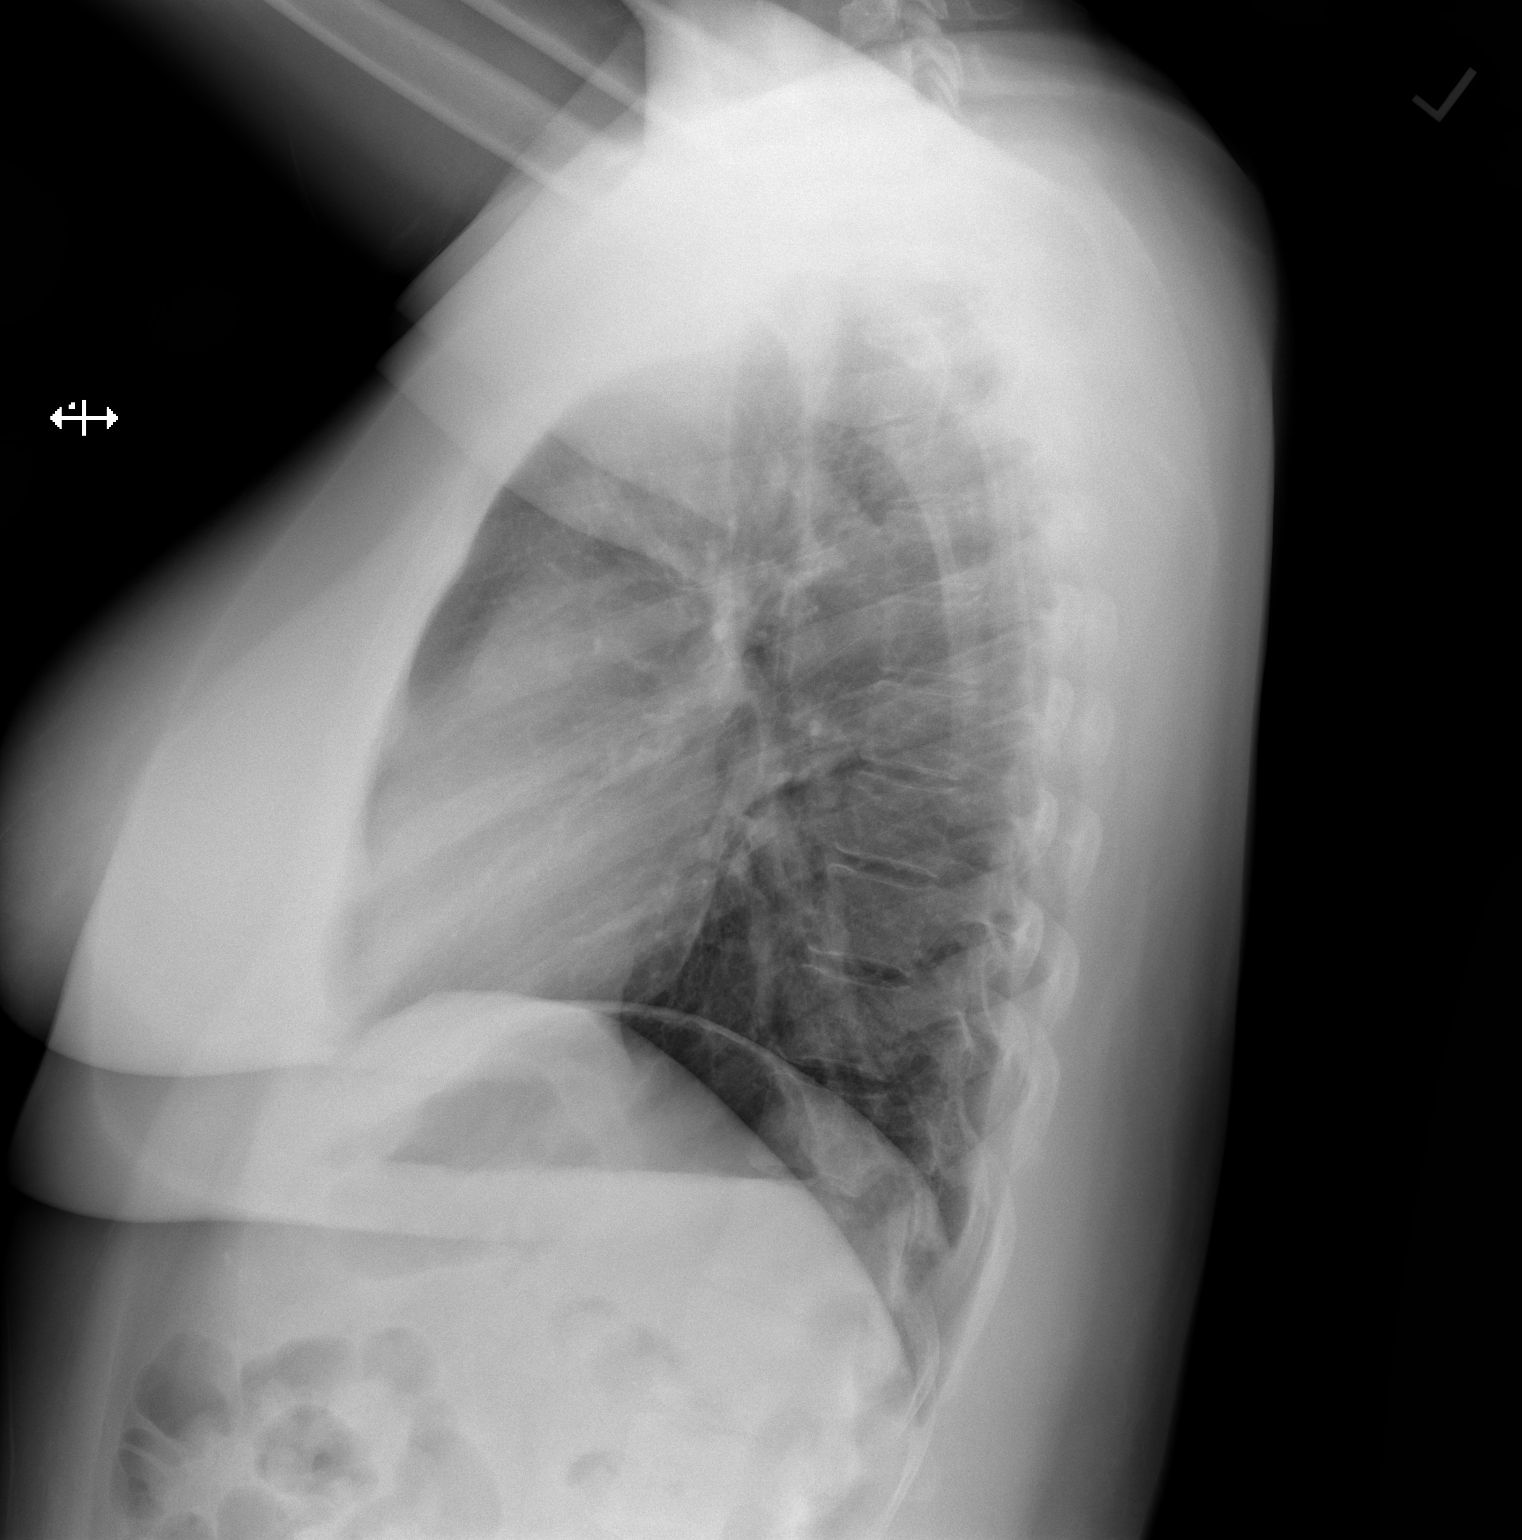

[2 of 2 positions shown; findings below may reference images not displayed]

FINDINGS: The heart size and mediastinal contours are within normal limits.
Both lungs are clear. The visualized skeletal structures are
unremarkable.
IMPRESSION: No active cardiopulmonary disease.

## 2021-07-08 DIAGNOSIS — K625 Hemorrhage of anus and rectum: Secondary | ICD-10-CM | POA: Insufficient documentation

## 2023-05-12 ENCOUNTER — Encounter (INDEPENDENT_AMBULATORY_CARE_PROVIDER_SITE_OTHER): Payer: Self-pay | Admitting: Otolaryngology

## 2023-05-12 ENCOUNTER — Ambulatory Visit (INDEPENDENT_AMBULATORY_CARE_PROVIDER_SITE_OTHER): Payer: BC Managed Care – PPO | Admitting: Otolaryngology

## 2023-05-12 VITALS — BP 143/92 | HR 69 | Ht 63.0 in | Wt 190.0 lb

## 2023-05-12 DIAGNOSIS — R0981 Nasal congestion: Secondary | ICD-10-CM

## 2023-05-12 DIAGNOSIS — R0982 Postnasal drip: Secondary | ICD-10-CM | POA: Diagnosis not present

## 2023-05-12 DIAGNOSIS — G4709 Other insomnia: Secondary | ICD-10-CM | POA: Diagnosis not present

## 2023-05-12 DIAGNOSIS — R0683 Snoring: Secondary | ICD-10-CM

## 2023-05-12 DIAGNOSIS — J3089 Other allergic rhinitis: Secondary | ICD-10-CM

## 2023-05-12 NOTE — Patient Instructions (Signed)
 Autumn Villegas Med Nasal Saline Rinse   - start nasal saline rinses with NeilMed Bottle available over the counter or online to help with nasal congestion

## 2023-05-12 NOTE — Progress Notes (Signed)
ENT CONSULT:  Reason for Consult: sleep apnea concern and chronic nasal congestion    HPI: Discussed the use of AI scribe software for clinical note transcription with the patient, who gave verbal consent to proceed.  History of Present Illness   Autumn Villegas is a 43 year old female who presents with concerns about sleep apnea. She was referred by her allergist to evaluate for potential sleep apnea and nasal congestion post-nasal drainage nasal obstruction.  Concerns about sleep apnea were initially raised by her dentist due to signs of teeth grinding and swollen gums, attributed to mouth breathing from nasal congestion. She experiences severe snoring, suspects apneic episodes during sleep, and has frequent awakenings with mild insomnia, often unable to fall asleep until 3 or 4 AM. She occasionally naps but does not feel well-rested. She has not been tested for sleep apnea and occasionally experiences migraines, making it difficult to discern if she has morning headaches. Her weight fluctuates slightly without major changes. She does not use any medications for insomnia. No episodes of falling asleep unexpectedly during the day, but she feels sleepy.  She has a history of allergies and has been receiving allergy shots for three weeks. She is on levocetirizine and montelukast but has discontinued nasal sprays like Flonase due to ineffectiveness. She is allergic to cats but has one at home, which her husband believes worsens her sleep issues. She experiences nasal congestion and drainage, contributing to her sleep difficulties.  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 She takes Valtrex, particularly around her menstrual cycle, to manage outbreaks. She has learned to detect symptoms early, such as swollen lymph nodes, to preemptively start treatment. She denies having cold sores around her lips but experiences sores in her  nose.    Past Medical History:  Diagnosis Date   Seasonal allergies    Western blot positive HSV2     Past Surgical History:  Procedure Laterality Date   bilateral breast implaints  06/21/2011   LAPAROSCOPY     LIPOSUCTION      Family History  Problem Relation Age of Onset   Arthritis Mother    Asthma Mother    Thyroid disease Mother     Social History:  reports that she has never smoked. She does not have any smokeless tobacco history on file. She reports current alcohol use. She reports that she does not use drugs.  Allergies: No Known Allergies  Medications: I have reviewed the patient's current medications.  The PMH, PSH, Medications, Allergies, and SH were reviewed and updated.  ROS: Constitutional: Negative for fever, weight loss and weight gain. Cardiovascular: Negative for chest pain and dyspnea on exertion. Respiratory: Is not experiencing shortness of breath at rest. Gastrointestinal: Negative for nausea and vomiting. Neurological: Negative for headaches. Psychiatric: The patient is not nervous/anxious  Blood pressure (!) 143/92, pulse 69, height 5\' 3"  (1.6 m), weight 190 lb (86.2 kg), SpO2 99%. Body mass index is 33.66 kg/m.  PHYSICAL EXAM:  Exam: General: Well-developed, well-nourished Respiratory Respiratory effort: Equal inspiration and expiration without stridor Cardiovascular Peripheral Vascular: Warm extremities with equal color/perfusion Eyes: No nystagmus with equal extraocular motion bilaterally Neuro/Psych/Balance: Patient oriented to person, place, and time; Appropriate mood and affect; Gait is intact with no imbalance; Cranial nerves I-XII are intact Head and Face Inspection: Normocephalic and atraumatic without mass or lesion Palpation: Facial skeleton intact without bony stepoffs Salivary Glands: No mass or  tenderness Facial Strength: Facial motility symmetric and full bilaterally ENT Pinna: External ear intact and fully  developed External canal: Canal is patent with intact skin Tympanic Membrane: Clear and mobile External Nose: No scar or anatomic deformity Internal Nose: Septum is S-shaped with leftward deviation, mild, small caudal spur on the right. No polyp, or purulence. Mucosal edema and erythema present.  Bilateral inferior turbinate hypertrophy.  Lips, Teeth, and gums: Mucosa and teeth intact and viable TMJ: No pain to palpation with full mobility Oral cavity/oropharynx: No erythema or exudate, no lesions present Friedman III tongue position 1+ tonsils b/l  Nasopharynx: No mass or lesion with intact mucosa Neck Neck and Trachea: Midline trachea without mass or lesion Thyroid: No mass or nodularity Lymphatics: No lymphadenopathy  Procedure:   PROCEDURE NOTE: nasal endoscopy  Preoperative diagnosis: chronic nasal congestion symptoms  Postoperative diagnosis: same  Procedure: Diagnostic nasal endoscopy (78295)  Surgeon: Ashok Croon, M.D.  Anesthesia: Topical lidocaine and Afrin  H&P REVIEW: The patient's history and physical were reviewed today prior to procedure. All medications were reviewed and updated as well. Complications: None Condition is stable throughout exam Indications and consent: The patient presents with symptoms of chronic sinusitis not responding to previous therapies. All the risks, benefits, and potential complications were reviewed with the patient preoperatively and informed consent was obtained. The time out was completed with confirmation of the correct procedure.   Procedure: The patient was seated upright in the clinic. Topical lidocaine and Afrin were applied to the nasal cavity. After adequate anesthesia had occurred, the rigid nasal endoscope was passed into the nasal cavity. The nasal mucosa, turbinates, septum, and sinus drainage pathways were visualized bilaterally. This revealed no purulence or significant secretions that might be cultured. There were no  polyps or sites of significant inflammation. The mucosa was intact and there was no crusting present. The scope was then slowly withdrawn and the patient tolerated the procedure well. There were no complications or blood loss.   Studies Reviewed: CXR 10/08/2019 IMPRESSION: No active cardiopulmonary disease.   Assessment/Plan: Encounter Diagnoses  Name Primary?   Environmental and seasonal allergies Yes   Chronic nasal congestion    Post-nasal drip    Snoring    Other insomnia     Assessment and Plan    Snoring and Concern for Sleep Apnea Presents with severe snoring, frequent awakenings, mild insomnia, nasal congestion, and potential mouth breathing daytime fatigue. No prior sleep study conducted. Differential diagnosis includes sleep apnea vs insomnia, vs other sleep disorders. Discussed referral to sleep medicine for testing and potential CPAP therapy if diagnosed with OSA. Explained that sleep surgery is an option for patients who fail CPAP therapy or have a hard time tolerating CPAP. - Order referral to sleep medicine for sleep apnea testing - Advise follow-up with sleep medicine specialist for further evaluation and management  Chronic nasal congestion and hx of Environmental Allergies  Year-round symptoms. Currently on allergy shots (third week), levocetirizine, and montelukast. Ineffective response to nasal sprays like Flonase and Nasonex. Nasal endoscopy examination reveals septal deviation, but mild, mucosal edema, and nasal congestion. Allergy shots may take up to a year to show full effect. Recommended continuing antihistamines and nasal sprays until significant improvement is noted. 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 Mucosal edema and nasal congestion present. Discussed septoplasty and inferior turbinate reduction as potential surgical options in the future. Patient expressed concerns about potential HSV outbreak with surgical intervention and would like to avoid surgery. Will continue medical management.  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  - Continue Valtrex prophylactically  - Monitor for symptoms and adjust Valtrex usage as needed  Follow-up - Follow up with allergist for allergy shots and prescriptions - Follow up with sleep medicine specialist for sleep apnea evaluation - Return to clinic as needed for further ENT evaluation or if considering septoplasty/ITR.          Thank you for allowing me to participate in the care of this patient. Please do not hesitate to contact me with any questions or concerns.   Ashok Croon, MD [facility] Phone: [phone] Fax: [phone]    05/12/2023, 11:52 AM

## 2023-05-18 ENCOUNTER — Encounter: Payer: Self-pay | Admitting: Sleep Medicine

## 2023-05-18 ENCOUNTER — Ambulatory Visit (INDEPENDENT_AMBULATORY_CARE_PROVIDER_SITE_OTHER): Payer: BC Managed Care – PPO | Admitting: Sleep Medicine

## 2023-05-18 VITALS — BP 130/84 | HR 83 | Temp 97.3°F | Ht 63.0 in | Wt 194.4 lb

## 2023-05-18 DIAGNOSIS — E669 Obesity, unspecified: Secondary | ICD-10-CM | POA: Diagnosis not present

## 2023-05-18 DIAGNOSIS — R0683 Snoring: Secondary | ICD-10-CM | POA: Diagnosis not present

## 2023-05-18 DIAGNOSIS — J309 Allergic rhinitis, unspecified: Secondary | ICD-10-CM | POA: Diagnosis not present

## 2023-05-18 DIAGNOSIS — Z6834 Body mass index (BMI) 34.0-34.9, adult: Secondary | ICD-10-CM

## 2023-05-18 DIAGNOSIS — G4733 Obstructive sleep apnea (adult) (pediatric): Secondary | ICD-10-CM

## 2023-05-18 DIAGNOSIS — R400 Somnolence: Secondary | ICD-10-CM | POA: Diagnosis not present

## 2023-05-18 DIAGNOSIS — E66811 Obesity, class 1: Secondary | ICD-10-CM

## 2023-05-18 NOTE — Progress Notes (Signed)
 Name:Autumn Villegas MRN: [id number] DOB: 10/28/1980   CHIEF COMPLAINT:  EXCESSIVE DAYTIME SLEEPINESS   HISTORY OF PRESENT ILLNESS:  Mrs. Autumn Villegas is a 43 y.o. w/ a h/o obesity, allergic rhinitis and GERD who presents for c/o loud snoring and excessive daytime sleepiness which has been present for several years. Reports nocturnal awakenings due to nocturia and does have difficulty falling back to sleep. Reports a 10 lb weight fluctuation over the last few years. Admits to night sweats, dry mouth and morning headaches. Admits to occasional RLS symptoms. Denies dream enactment. Reports a family history of sleep apnea. Denies drowsy driving. Drinks 1 energy drink and 1 soda daily, denies alcohol or illicit drug use. Vapes throughout the day.   Bedtime 9 pm- 12 am Sleep onset 30-40 mins Rise time 6 am   EPWORTH SLEEP SCORE 15    05/18/2023    2:00 PM  Results of the Epworth flowsheet  Sitting and reading 2  Watching TV 3  Sitting, inactive in a public place (e.g. a theatre or a meeting) 2  As a passenger in a car for an hour without a break 2  Lying down to rest in the afternoon when circumstances permit 3  Sitting and talking to someone 1  Sitting quietly after a lunch without alcohol 2  In a car, while stopped for a few minutes in traffic 0  Total score 15     PAST MEDICAL HISTORY :   has a past medical history of Seasonal allergies and Western blot positive HSV2.  has a past surgical history that includes laparoscopy; bilateral breast implaints (06/21/2011); and Liposuction. Prior to Admission medications   Medication Sig Start Date End Date Taking? Authorizing Provider  EPINEPHrine 0.3 mg/0.3 mL IJ SOAJ injection Inject 0.3 mg into the muscle as needed for anaphylaxis.   Yes [provider]  levocetirizine (XYZAL) 5 MG tablet Take 5 mg by mouth daily. 05/01/23  Yes [provider]  montelukast (SINGULAIR) 10 MG tablet TAKE 1 TABLET BY MOUTH EVERY DAY FOR  30 DAYS   Yes [provider]  valACYclovir (VALTREX) 500 MG tablet Take 1 tablet (500 mg total) by mouth 2 (two) times daily as needed. 12/12/11  Yes Osborn Coho, MD  cetirizine (ZYRTEC) 10 MG tablet Take 10 mg by mouth daily. Patient not taking: Reported on 05/18/2023    [provider]  fluticasone (FLONASE) 50 MCG/ACT nasal spray Place 2 sprays into the nose daily. 04/16/11 04/15/12  Josefina Do., MD  Norgestim-Eth Estrad Triphasic (TRINESSA, 28, PO) Take by mouth. Patient not taking: Reported on 05/18/2023    [provider]  Norgestimate-Ethinyl Estradiol Triphasic 0.18/0.215/0.25 MG-35 MCG tablet Take 1 tablet by mouth daily. Patient not taking: Reported on 05/18/2023 12/12/11   Osborn Coho, MD  omeprazole (PRILOSEC) 40 MG capsule Take 1 capsule (40 mg total) by mouth daily. Patient not taking: Reported on 05/18/2023 01/21/12   Johnsie Kindred, NP  ondansetron (ZOFRAN) 4 MG tablet Take 1 tablet (4 mg total) by mouth every 6 (six) hours. Patient not taking: Reported on 05/18/2023 01/21/12   Johnsie Kindred, NP   No Known Allergies  FAMILY HISTORY:  family history includes Arthritis in her mother; Asthma in her mother; Thyroid disease in her mother. SOCIAL HISTORY:  reports that she has never smoked. She does not have any smokeless tobacco history on file. She reports current alcohol use. She reports that she does not use drugs.  Review of Systems:  Gen:  Denies  fever, sweats, chills weight loss  HEENT: Denies blurred vision, double vision, ear pain, eye pain, hearing loss, nose bleeds, sore throat Cardiac:  No dizziness, chest pain or heaviness, chest tightness,edema, No JVD Resp:   No cough, -sputum production, -shortness of breath,-wheezing, -hemoptysis,  Gi: Denies swallowing difficulty, stomach pain, nausea or vomiting, diarrhea, constipation, bowel incontinence Gu:  Denies bladder incontinence, burning urine Ext:   Denies Joint pain,  stiffness or swelling Skin: Denies  skin rash, easy bruising or bleeding or hives Endoc:  Denies polyuria, polydipsia , polyphagia or weight change Psych:   Denies depression, insomnia or hallucinations  Other:  All other systems negative  VITAL SIGNS: BP 130/84 (BP Location: Right Arm, Cuff Size: Normal)   Pulse 83   Temp (!) 97.3 F (36.3 C)   Ht 5\' 3"  (1.6 m)   Wt 194 lb 6.4 oz (88.2 kg)   SpO2 97%   BMI 34.44 kg/m    Physical Examination:   General Appearance: No distress  EYES PERRLA, EOM intact.   NECK Supple, No JVD Pulmonary: normal breath sounds, No wheezing.  CardiovascularNormal S1,S2.  No m/r/g.   Abdomen: Benign, Soft, non-tender. Skin:   warm, no rashes, no ecchymosis  Extremities: normal, no cyanosis, clubbing. Neuro:without focal findings,  speech normal  PSYCHIATRIC: Mood, affect within normal limits.   ASSESSMENT AND PLAN  OSA I suspect that OSA is likely present due to clinical presentation. Discussed the consequences of untreated sleep apnea. Advised not to drive drowsy for safety of patient and others. Will complete further evaluation with a home sleep study and follow up to review results.    Allergic rhinitis Stable, on current management. Following with PCP.   Obesity Counseled patient on diet and lifestyle modification.    MEDICATION ADJUSTMENTS/LABS AND TESTS ORDERED: Recommend Sleep Study   Patient satisfied with Plan of action and management. All questions answered  Follow up to review HST results and treatment plan.   I spent a total of 45 minutes reviewing chart data, face-to-face evaluation with the patient, counseling and coordination of care as detailed above.    Tempie Hoist, M.D.  Sleep Medicine [facility]

## 2023-05-18 NOTE — Patient Instructions (Signed)
 Autumn Villegas

## 2023-06-07 ENCOUNTER — Encounter

## 2023-06-07 DIAGNOSIS — G4733 Obstructive sleep apnea (adult) (pediatric): Secondary | ICD-10-CM

## 2023-06-12 ENCOUNTER — Telehealth: Payer: Self-pay

## 2023-06-12 DIAGNOSIS — G4733 Obstructive sleep apnea (adult) (pediatric): Secondary | ICD-10-CM

## 2023-06-12 NOTE — Telephone Encounter (Signed)
-----   Message from [facility] D REDDY sent at 06/12/2023 11:02 AM EDT ----- Regarding: HST results HST revealed moderate OSA, recommend starting on APAP therapy set to 4-16 cm H2O, EPR 3 with the Airtouch N30i nasal mask. Please also schedule a 3 month CPAP follow up visit. Thanks ----- Message ----- From: Lilian Kapur Sent: 06/07/2023   5:54 PM EDT To: Alanda Slim, MD

## 2023-06-16 ENCOUNTER — Ambulatory Visit: Admitting: Sleep Medicine

## 2023-09-08 ENCOUNTER — Ambulatory Visit: Admitting: Sleep Medicine

## 2023-09-08 ENCOUNTER — Encounter: Payer: Self-pay | Admitting: Sleep Medicine

## 2023-09-08 VITALS — BP 124/84 | HR 90 | Temp 96.9°F | Ht 63.0 in | Wt 195.2 lb

## 2023-09-08 DIAGNOSIS — E66811 Obesity, class 1: Secondary | ICD-10-CM

## 2023-09-08 DIAGNOSIS — E669 Obesity, unspecified: Secondary | ICD-10-CM | POA: Diagnosis not present

## 2023-09-08 DIAGNOSIS — G4733 Obstructive sleep apnea (adult) (pediatric): Secondary | ICD-10-CM

## 2023-09-08 DIAGNOSIS — Z6834 Body mass index (BMI) 34.0-34.9, adult: Secondary | ICD-10-CM | POA: Diagnosis not present

## 2023-09-08 NOTE — Patient Instructions (Signed)

## 2023-09-08 NOTE — Progress Notes (Signed)
 Name:Autumn Villegas MRN: [id number] DOB: 10/12/1980   CHIEF COMPLAINT:  CPAP F/U   HISTORY OF PRESENT ILLNESS:  Autumn Villegas is a 43 y.o. w/ a h/o OSA and obesity who presents for CPAP F/U visit. Reports using CPAP therapy almost every night, which is confirmed by compliance data. She is currently using the Airfit N30i nasal mask, which is comfortable. Denies dry mouth or mask leaks. Reports feeling more refreshed upon awakening with CPAP therapy.    EPWORTH SLEEP SCORE    05/18/2023    2:00 PM  Results of the Epworth flowsheet  Sitting and reading 2  Watching TV 3  Sitting, inactive in a public place (e.g. a theatre or a meeting) 2  As a passenger in a car for an hour without a break 2  Lying down to rest in the afternoon when circumstances permit 3  Sitting and talking to someone 1  Sitting quietly after a lunch without alcohol 2  In a car, while stopped for a few minutes in traffic 0  Total score 15      PAST MEDICAL HISTORY :   has a past medical history of Deviated septum, Seasonal allergies, and Western blot positive HSV2.  has a past surgical history that includes laparoscopy; bilateral breast implaints (06/21/2011); and Liposuction. Prior to Admission medications   Medication Sig Start Date End Date Taking? Authorizing Provider  EPINEPHrine 0.3 mg/0.3 mL IJ SOAJ injection Inject 0.3 mg into the muscle as needed for anaphylaxis.   Yes [provider]  levocetirizine (XYZAL) 5 MG tablet Take 5 mg by mouth daily. 05/01/23  Yes [provider]  montelukast (SINGULAIR) 10 MG tablet TAKE 1 TABLET BY MOUTH EVERY DAY FOR 30 DAYS   Yes [provider]  valACYclovir  (VALTREX ) 500 MG tablet Take 1 tablet (500 mg total) by mouth 2 (two) times daily as needed. 12/12/11  Yes Renea Carrion, MD  cetirizine (ZYRTEC) 10 MG tablet Take 10 mg by mouth daily. Patient not taking: Reported on 09/08/2023    [provider]  fluticasone  (FLONASE ) 50  MCG/ACT nasal spray Place 2 sprays into the nose daily. Patient not taking: Reported on 09/08/2023 04/16/11 04/15/12  Stacy Eagle., MD  Norgestim-Eth Estrad Triphasic (TRINESSA, 28, PO) Take by mouth. Patient not taking: Reported on 09/08/2023    [provider]  Norgestimate-Ethinyl Estradiol Triphasic 0.18/0.215/0.25 MG-35 MCG tablet Take 1 tablet by mouth daily. Patient not taking: Reported on 09/08/2023 12/12/11   Renea Carrion, MD  omeprazole  (PRILOSEC) 40 MG capsule Take 1 capsule (40 mg total) by mouth daily. Patient not taking: Reported on 09/08/2023 01/21/12   Gaile Jourdain, NP  ondansetron  (ZOFRAN ) 4 MG tablet Take 1 tablet (4 mg total) by mouth every 6 (six) hours. Patient not taking: Reported on 09/08/2023 01/21/12   Gaile Jourdain, NP   No Known Allergies  FAMILY HISTORY:  family history includes Arthritis in her mother; Asthma in her mother; Thyroid disease in her mother. SOCIAL HISTORY:  reports that she has never smoked. She does not have any smokeless tobacco history on file. She reports current alcohol use. She reports that she does not use drugs.   Review of Systems:  Gen:  Denies  fever, sweats, chills weight loss  HEENT: Denies blurred vision, double vision, ear pain, eye pain, hearing loss, nose bleeds, sore throat Cardiac:  No dizziness, chest pain or heaviness, chest tightness,edema, No JVD Resp:   No cough, -sputum production, -  shortness of breath,-wheezing, -hemoptysis,  Gi: Denies swallowing difficulty, stomach pain, nausea or vomiting, diarrhea, constipation, bowel incontinence Gu:  Denies bladder incontinence, burning urine Ext:   Denies Joint pain, stiffness or swelling Skin: Denies  skin rash, easy bruising or bleeding or hives Endoc:  Denies polyuria, polydipsia , polyphagia or weight change Psych:   Denies depression, insomnia or hallucinations  Other:  All other systems negative  VITAL SIGNS: BP 124/84 (BP Location: Right Arm, Cuff  Size: Normal)   Pulse 90   Temp (!) 96.9 F (36.1 C)   Ht 5' 3 (1.6 m)   Wt 195 lb 3.2 oz (88.5 kg)   SpO2 97%   BMI 34.58 kg/m    Physical Examination:   General Appearance: No distress  EYES PERRLA, EOM intact.   NECK Supple, No JVD Pulmonary: normal breath sounds, No wheezing.  CardiovascularNormal S1,S2.  No m/r/g.   Abdomen: Benign, Soft, non-tender. Skin:   warm, no rashes, no ecchymosis  Extremities: normal, no cyanosis, clubbing. Neuro:without focal findings,  speech normal  PSYCHIATRIC: Mood, affect within normal limits.   ASSESSMENT AND PLAN  OSA Patient is using and benefiting from CPAP therapy. Discussed the consequences of untreated sleep apnea. Advised not to drive drowsy for safety of patient and others. Will follow up in 6 months.    Obesity Counseled patient on diet and lifestyle modification.    Patient  satisfied with Plan of action and management. All questions answered  I spent a total of 32 minutes reviewing chart data, face-to-face evaluation with the patient, counseling and coordination of care as detailed above.    Adarrius Graeff, M.D.  Sleep Medicine [facility]

## 2023-09-28 ENCOUNTER — Encounter: Payer: Self-pay | Admitting: Podiatry

## 2023-09-28 ENCOUNTER — Ambulatory Visit (INDEPENDENT_AMBULATORY_CARE_PROVIDER_SITE_OTHER): Admitting: Podiatry

## 2023-09-28 DIAGNOSIS — J3081 Allergic rhinitis due to animal (cat) (dog) hair and dander: Secondary | ICD-10-CM | POA: Insufficient documentation

## 2023-09-28 DIAGNOSIS — Z720 Tobacco use: Secondary | ICD-10-CM | POA: Insufficient documentation

## 2023-09-28 DIAGNOSIS — N390 Urinary tract infection, site not specified: Secondary | ICD-10-CM | POA: Insufficient documentation

## 2023-09-28 DIAGNOSIS — J309 Allergic rhinitis, unspecified: Secondary | ICD-10-CM | POA: Insufficient documentation

## 2023-09-28 DIAGNOSIS — L603 Nail dystrophy: Secondary | ICD-10-CM | POA: Diagnosis not present

## 2023-09-28 DIAGNOSIS — J301 Allergic rhinitis due to pollen: Secondary | ICD-10-CM | POA: Insufficient documentation

## 2023-09-28 DIAGNOSIS — N898 Other specified noninflammatory disorders of vagina: Secondary | ICD-10-CM | POA: Insufficient documentation

## 2023-09-28 DIAGNOSIS — R300 Dysuria: Secondary | ICD-10-CM | POA: Insufficient documentation

## 2023-09-28 DIAGNOSIS — H1045 Other chronic allergic conjunctivitis: Secondary | ICD-10-CM | POA: Insufficient documentation

## 2023-09-28 DIAGNOSIS — R059 Cough, unspecified: Secondary | ICD-10-CM | POA: Insufficient documentation

## 2023-09-28 NOTE — Patient Instructions (Signed)

## 2023-09-28 NOTE — Progress Notes (Addendum)
       Subjective:  Patient ID: Autumn Villegas, female    DOB: 12-10-1980,  MRN: [id number]  Autumn Villegas presents to clinic today for:  Chief Complaint  Patient presents with   Toe Injury    Hallux left - injured toe yesterday by hitting it against the box, cracked the toenail and started to bleed, not really sore, kept bandaged, elevated all evening, doesn't want it removed, wants to have it glued back together, taking Ibuprofen PRN   New Patient (Initial Visit)  Patient presenting for above complaint.  Did sustain injury to the left first toenail after accidentally kicking a box when moving some things around.  Nail plate is split longitudinally.  PCP is Millicent Sharper, MD.  No Known Allergies  Review of Systems: Negative except as noted in the HPI.  Objective:  There were no vitals filed for this visit.  Autumn Villegas is a pleasant 43 y.o. female in NAD. AAO x 3.  Vascular Examination: Capillary refill time is less than 3 seconds to toes bilateral. Palpable pedal pulses b/l LE. Digital hair present b/l. No pedal edema b/l. Skin temperature gradient WNL b/l. No varicosities b/l. No cyanosis or clubbing noted b/l.   Dermatological Examination: Left hallux nail plate damage noted with longitudinal splitting down the nail centrally.  There is some tenderness on palpation.  Neurological Examination: Protective sensation intact with Semmes-Weinstein 10 gram monofilament b/l LE. Vibratory sensation intact b/l LE.      No data to display           Assessment/Plan: 1. 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   Discussed patient's condition today.  Recommend temporary removal of the toenail with total nail avulsion.  She is initially hesitant to this but is agreeable.  Did discuss likelihood that the nail may grow back abnormal due to the trauma from her injury.  After obtaining patient consent, the left hallux was  anesthetized with a 50:50 mixture of 1% lidocaine plain and 0.5% bupivacaine plain for a total of 3cc's administered.  Upon confirmation of anesthesia, a freer elevator was utilized to free the left hallux nail plate from the nail bed.  The nail plate was then avulsed proximal to the eponychium and removed in toto.  The area was inspected for any remaining spicules.  No chemical matrixectomy was performed.  Antibiotic ointment and a DSD were applied, followed by a Coban dressing.  Patient tolerated the anesthetic and procedure well and will f/u in 2-3 weeks for recheck.  Patient given post-procedure instructions for daily 20-minute Epsom salt soaks, antibiotic ointment and daily use of Bandaids until toe starts to dry / form eschar.    Return in about 2 weeks (around 10/12/2023) for Nail Check.   Ethan Saddler, DPM, AACFAS [facility]     [address] [place], [place] [address]                Office [phone]  Fax [phone]

## 2023-10-02 NOTE — Addendum Note (Signed)
 Addended by: LAMOUNT ETHAN CROME on: 10/02/2023 07:43 AM   Modules accepted: Level of Service

## 2023-10-20 ENCOUNTER — Encounter: Payer: Self-pay | Admitting: Podiatry

## 2023-10-20 ENCOUNTER — Ambulatory Visit: Admitting: Podiatry

## 2023-10-20 DIAGNOSIS — L603 Nail dystrophy: Secondary | ICD-10-CM

## 2023-10-20 NOTE — Patient Instructions (Signed)
 You can scrub the edges of the toenail and treat the callus with daily scrubs of white vinegar using a toothbrush for several minutes at a time.  This make it easier for you to file it down with a pumice stone or emery board.  If you notice any elevated callus to the nailbed as your nail grows out, you can rub topical urea cream on the area and this may help facilitate the nail plate growing with minimal deformity.  You can get urea cream from [employer] ranging from 10% to 40% concentration.

## 2023-10-20 NOTE — Progress Notes (Signed)
       Subjective:  Patient ID: Autumn Villegas, female    DOB: [birth year],  MRN: [id number]  Chief Complaint  Patient presents with   Ingrown Toenail    Pt is here for check up on L big toe.  Is not having any pain. She soaked her toe for the first week twice a day. She has been able to wear socks and shoes.    Autumn Villegas presents to clinic today for f/u of total nail avulsion to the left hallux following nail plate contusion from direct trauma.  She is doing well.  Denies pain.  PCP is Millicent Sharper, MD.  No Known Allergies  Objective:  There were no vitals filed for this visit.  Vascular Examination: Capillary refill time is less than 3 seconds to toes bilateral. Palpable pedal pulses b/l LE. Digital hair present b/l. No pedal edema b/l. Skin temperature gradient WNL b/l. No varicosities b/l. No cyanosis or clubbing noted b/l.   Dermatological Examination: Left hallux total nail avulsion site healing well.  Stable scab present to nail bed.  There is very small sliver of nail lateral border with adjacent hyperkeratotic callus skin in the area of the nail fold.  This is asymptomatic for the patient  Assessment/Plan: 1. 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  Toenail avulsion site healing well.  Did trim at area of remaining nail lateral border.  Advised patient to monitor the site and to scrub with white vinegar to soften up any nail callus and to file down with emery board as needed.  Follow-up as needed if this becomes symptomatic.  Return if symptoms worsen or fail to improve.   Ethan LITTIE Saddler, DPM, AACFAS [facility]     [address] [place], [place] [address]                Office [phone]  Fax [phone]

## 2024-03-18 ENCOUNTER — Ambulatory Visit: Admitting: Sleep Medicine
